# Patient Record
Sex: Female | Born: 1985 | Race: Black or African American | Hispanic: No | Marital: Single
Health system: Southern US, Community
[De-identification: ages and names within clinical notes are randomized; demographics above are authoritative.]

## PROBLEM LIST (undated history)

## (undated) DIAGNOSIS — F419 Anxiety disorder, unspecified: Secondary | ICD-10-CM

## (undated) HISTORY — PX: NO PAST SURGERIES: SHX2092

---

## 2009-05-20 ENCOUNTER — Emergency Department (HOSPITAL_COMMUNITY): Admission: EM | Admit: 2009-05-20 | Discharge: 2009-05-20 | Payer: Self-pay | Admitting: Emergency Medicine

## 2009-05-21 ENCOUNTER — Emergency Department (HOSPITAL_COMMUNITY): Admission: EM | Admit: 2009-05-21 | Discharge: 2009-05-22 | Payer: Self-pay | Admitting: Emergency Medicine

## 2010-07-30 ENCOUNTER — Emergency Department (HOSPITAL_COMMUNITY)
Admission: EM | Admit: 2010-07-30 | Discharge: 2010-07-30 | Payer: Self-pay | Source: Home / Self Care | Admitting: Emergency Medicine

## 2010-10-30 LAB — POCT CARDIAC MARKERS: Troponin i, poc: <0.05 ng/mL (ref 0.00–0.09)
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
## 2014-12-10 ENCOUNTER — Inpatient Hospital Stay (HOSPITAL_COMMUNITY)
Admission: AD | Admit: 2014-12-10 | Discharge: 2014-12-10 | Disposition: A | Discharge status: Home / Self Care | Payer: PRIVATE HEALTH INSURANCE | Source: Ambulatory Visit | Attending: Family Medicine | Admitting: Family Medicine

## 2014-12-10 ENCOUNTER — Encounter (HOSPITAL_COMMUNITY): Payer: Self-pay | Admitting: Obstetrics and Gynecology

## 2014-12-10 DIAGNOSIS — R102 Pelvic and perineal pain: Secondary | ICD-10-CM | POA: Insufficient documentation

## 2014-12-10 DIAGNOSIS — N760 Acute vaginitis: Secondary | ICD-10-CM | POA: Diagnosis not present

## 2014-12-10 DIAGNOSIS — B9689 Other specified bacterial agents as the cause of diseases classified elsewhere: Secondary | ICD-10-CM

## 2014-12-10 DIAGNOSIS — A499 Bacterial infection, unspecified: Secondary | ICD-10-CM

## 2014-12-10 HISTORY — DX: Anxiety disorder, unspecified: F41.9

## 2014-12-10 LAB — URINALYSIS, ROUTINE W REFLEX MICROSCOPIC
Bilirubin Urine: NEGATIVE
Glucose, UA: NEGATIVE mg/dL
HGB URINE DIPSTICK: NEGATIVE
KETONES UR: NEGATIVE mg/dL
Leukocytes, UA: NEGATIVE
Nitrite: NEGATIVE
PROTEIN: NEGATIVE mg/dL
Specific Gravity, Urine: >1.03 — ABNORMAL HIGH (ref 1.005–1.030)
UROBILINOGEN UA: 0.2 mg/dL (ref 0.0–1.0)
pH: 5.5 (ref 5.0–8.0)

## 2014-12-10 LAB — WET PREP, GENITAL
TRICH WET PREP: NONE SEEN
YEAST WET PREP: NONE SEEN

## 2014-12-10 LAB — CBC
HCT: 38.2 % (ref 36.0–46.0)
Hemoglobin: 13.2 g/dL (ref 12.0–15.0)
MCH: 28.8 pg (ref 26.0–34.0)
MCHC: 34.6 g/dL (ref 30.0–36.0)
MCV: 83.4 fL (ref 78.0–100.0)
PLATELETS: 378 10*3/uL (ref 150–400)
RBC: 4.58 MIL/uL (ref 3.87–5.11)
RDW: 13.2 % (ref 11.5–15.5)
WBC: 6.7 10*3/uL (ref 4.0–10.5)

## 2014-12-10 MED ORDER — FLUCONAZOLE 150 MG PO TABS: 150.0000 mg | ORAL_TABLET | Freq: Every day | ORAL | Status: AC

## 2014-12-10 MED ORDER — METRONIDAZOLE 500 MG PO TABS: 500.0000 mg | ORAL_TABLET | Freq: Two times a day (BID) | ORAL | Status: AC

## 2014-12-10 MED ORDER — FLUCONAZOLE 150 MG PO TABS
150.0000 mg | ORAL_TABLET | Freq: Once | ORAL | Status: DC
Start: 2014-12-10 — End: 2014-12-10

## 2014-12-10 NOTE — Discharge Instructions (Signed)
Bacterial Vaginosis Bacterial vaginosis is a vaginal infection that occurs when the normal balance of bacteria in the vagina is disrupted. It results from an overgrowth of certain bacteria. This is the most common vaginal infection in women of childbearing age. Treatment is important to prevent complications, especially in pregnant women, as it can cause a premature delivery. CAUSES  Bacterial vaginosis is caused by an increase in harmful bacteria that are normally present in smaller amounts in the vagina. Several different kinds of bacteria can cause bacterial vaginosis. However, the reason that the condition develops is not fully understood. RISK FACTORS Certain activities or behaviors can put you at an increased risk of developing bacterial vaginosis, including:  Having a new sex partner or multiple sex partners.  Douching.  Using an intrauterine device (IUD) for contraception. Women do not get bacterial vaginosis from toilet seats, bedding, swimming pools, or contact with objects around them. SIGNS AND SYMPTOMS  Some women with bacterial vaginosis have no signs or symptoms. Common symptoms include:  Grey vaginal discharge.  A fishlike odor with discharge, especially after sexual intercourse.  Itching or burning of the vagina and vulva.  Burning or pain with urination. DIAGNOSIS  Your health care provider will take a medical history and examine the vagina for signs of bacterial vaginosis. A sample of vaginal fluid may be taken. Your health care provider will look at this sample under a microscope to check for bacteria and abnormal cells. A vaginal pH test may also be done.  TREATMENT  Bacterial vaginosis may be treated with antibiotic medicines. These may be given in the form of a pill or a vaginal cream. A second round of antibiotics may be prescribed if the condition comes back after treatment.  HOME CARE INSTRUCTIONS   Only take over-the-counter or prescription medicines as  directed by your health care provider.  If antibiotic medicine was prescribed, take it as directed. Make sure you finish it even if you start to feel better.  Do not have sex until treatment is completed.  Tell all sexual partners that you have a vaginal infection. They should see their health care provider and be treated if they have problems, such as a mild rash or itching.  Practice safe sex by using condoms and only having one sex partner. SEEK MEDICAL CARE IF:   Your symptoms are not improving after 3 days of treatment.  You have increased discharge or pain.  You have a fever. MAKE SURE YOU:   Understand these instructions.  Will watch your condition.  Will get help right away if you are not doing well or get worse. FOR MORE INFORMATION  Centers for Disease Control and Prevention, Division of STD Prevention: www.cdc.gov/std American Sexual Health Association (ASHA): www.ashastd.org  Document Released: 07/13/2005 Document Revised: 05/03/2013 Document Reviewed: 02/22/2013 [employer] Patient Information 2015 [employer]. This information is not intended to replace advice given to you by your health care provider. Make sure you discuss any questions you have with your health care provider.  

## 2014-12-10 NOTE — MAU Note (Signed)
Pt reports she has been having  A brown vaginal discharge and pelvic pain x 1 month. THis was the first day she could come down to get it looked at.

## 2014-12-10 NOTE — MAU Provider Note (Signed)
History     CSN: [id number]  Arrival date and time: 12/10/14 1712   First Provider Initiated Contact with Patient 12/10/14 1828      Chief Complaint  Patient presents with  . Pelvic Pain  . Vaginal Discharge   HPI   Ms. Deborah Wilkinson is a 29 y.o. female No obstetric history on file. Who presents with pelvic pain. She has had this for over a month. The pain started on the left side and is now in the right lower quadrant.  The pain is currently on both sides.  Currently her pain is a 3/10. The pain comes and goes and when is comes the pain is sharp.  She is also experiencing vaginal itching that started around the same time; she tried over the counter cream with some relief.   No history of STI's; new sexual partner.   OB History    No data available      Past Medical History  Diagnosis Date  . Anxiety     Past Surgical History  Procedure Laterality Date  . No past surgeries      No family history on file.  History  Substance Use Topics  . Smoking status: Not on file  . Smokeless tobacco: Not on file  . Alcohol Use: Not on file    Allergies:  Allergies  Allergen Reactions  . Other     Unknown antibiotic, not amoxicillin    No prescriptions prior to admission   Results for orders placed or performed during the hospital encounter of 12/10/14 (from the past 48 hour(s))  Urinalysis, Routine w reflex microscopic     Status: Abnormal   Collection Time: 12/10/14  6:35 PM  Result Value Ref Range   Color, Urine YELLOW YELLOW   APPearance CLEAR CLEAR   Specific Gravity, Urine >1.030 (H) 1.005 - 1.030   pH 5.5 5.0 - 8.0   Glucose, UA NEGATIVE NEGATIVE mg/dL   Hgb urine dipstick NEGATIVE NEGATIVE   Bilirubin Urine NEGATIVE NEGATIVE   Ketones, ur NEGATIVE NEGATIVE mg/dL   Protein, ur NEGATIVE NEGATIVE mg/dL   Urobilinogen, UA 0.2 0.0 - 1.0 mg/dL   Nitrite NEGATIVE NEGATIVE   Leukocytes, UA NEGATIVE NEGATIVE    Comment: MICROSCOPIC NOT DONE ON URINES WITH  NEGATIVE PROTEIN, BLOOD, LEUKOCYTES, NITRITE, OR GLUCOSE <1000 mg/dL.  Wet prep, genital     Status: Abnormal   Collection Time: 12/10/14  6:45 PM  Result Value Ref Range   Yeast Wet Prep HPF POC NONE SEEN NONE SEEN   Trich, Wet Prep NONE SEEN NONE SEEN   Clue Cells Wet Prep HPF POC FEW (A) NONE SEEN   WBC, Wet Prep HPF POC FEW (A) NONE SEEN    Comment: MODERATE BACTERIA SEEN  CBC     Status: None   Collection Time: 12/10/14  7:04 PM  Result Value Ref Range   WBC 6.7 4.0 - 10.5 K/uL   RBC 4.58 3.87 - 5.11 MIL/uL   Hemoglobin 13.2 12.0 - 15.0 g/dL   HCT 57.838.2 46.936.0 - 62.946.0 %   MCV 83.4 78.0 - 100.0 fL   MCH 28.8 26.0 - 34.0 pg   MCHC 34.6 30.0 - 36.0 g/dL   RDW 52.813.2 41.311.5 - 24.415.5 %   Platelets 378 150 - 400 K/uL    Review of Systems  Constitutional: Negative for fever.  Gastrointestinal: Positive for abdominal pain. Negative for nausea and vomiting.  Genitourinary: Positive for dysuria.       Thick, brown  vaginal discharge.  LMP was 5/8   Physical Exam   Blood pressure 130/81, pulse 86, temperature 99.6 F (37.6 C), resp. rate 18, height 5' 6.5" (1.689 m), weight 75.932 kg (167 lb 6.4 oz), last menstrual period 12/02/2014.  Physical Exam  Constitutional: She is oriented to person, place, and time.  Respiratory: Effort normal.  GI: Soft. She exhibits no distension and no mass. There is no tenderness. There is no rebound and no guarding.  Genitourinary:  Speculum exam: Vagina - Small amount of creamy discharge, no odor Cervix - No contact bleeding Bimanual exam: Cervix closed, no CMT  Uterus non tender, normal size Adnexa non tender, no masses bilaterally Stool palpated in the rectum.  GC/Chlam, wet prep done Chaperone present for exam.  Musculoskeletal: Normal range of motion.  Neurological: She is alert and oriented to person, place, and time.  Skin: Skin is warm.  Psychiatric: Her behavior is normal.    MAU Course  Procedures  MDM  Wet prep GC HIV  pending  Assessment and Plan   A:  1. BV (bacterial vaginosis)   2. Vaginitis     P:  Discharge home in stable condition RX: Flagyl- no alcohol        Diflucan to take after treatment Follow up with PCP as needed Condoms always   Duane LopeJennifer I Rasch, NP  12/10/2014 6:33 PM

## 2014-12-11 ENCOUNTER — Telehealth (HOSPITAL_COMMUNITY): Payer: Self-pay | Admitting: *Deleted

## 2014-12-11 LAB — HIV ANTIBODY (ROUTINE TESTING W REFLEX): HIV SCREEN 4TH GENERATION: NONREACTIVE

## 2014-12-11 LAB — POCT PREGNANCY, URINE: Preg Test, Ur: NEGATIVE

## 2014-12-11 LAB — GC/CHLAMYDIA PROBE AMP (~~LOC~~) NOT AT ARMC
CHLAMYDIA, DNA PROBE: NEGATIVE
NEISSERIA GONORRHEA: NEGATIVE
